# Patient Record
Sex: Female | Born: 2000 | Race: Black or African American | Hispanic: No | Marital: Single | Smoking: Never smoker
Health system: Southern US, Community
[De-identification: ages and names within clinical notes are randomized; demographics above are authoritative.]

---
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
## 2019-07-31 ENCOUNTER — Other Ambulatory Visit: Payer: Self-pay

## 2019-07-31 ENCOUNTER — Ambulatory Visit: Payer: Self-pay | Attending: Internal Medicine

## 2019-07-31 DIAGNOSIS — Z20822 Contact with and (suspected) exposure to covid-19: Secondary | ICD-10-CM

## 2019-08-01 LAB — NOVEL CORONAVIRUS, NAA: SARS-CoV-2, NAA: NOT DETECTED
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
## 2023-06-17 NOTE — ED Notes (Signed)
Patient left with no distress. Steady gait noted.patient alert and oriented x4.patient given discharge instructions and follow up care instructions and able to verbalize understanding.

## 2024-03-29 ENCOUNTER — Emergency Department (HOSPITAL_COMMUNITY)

## 2024-03-29 ENCOUNTER — Encounter (HOSPITAL_COMMUNITY): Payer: Self-pay

## 2024-03-29 ENCOUNTER — Emergency Department (HOSPITAL_COMMUNITY)
Admission: EM | Admit: 2024-03-29 | Discharge: 2024-03-29 | Disposition: A | Discharge status: Home / Self Care | Attending: Emergency Medicine | Admitting: Emergency Medicine

## 2024-03-29 ENCOUNTER — Other Ambulatory Visit: Payer: Self-pay

## 2024-03-29 DIAGNOSIS — Y92410 Unspecified street and highway as the place of occurrence of the external cause: Secondary | ICD-10-CM | POA: Diagnosis not present

## 2024-03-29 DIAGNOSIS — S0990XA Unspecified injury of head, initial encounter: Secondary | ICD-10-CM | POA: Insufficient documentation

## 2024-03-29 DIAGNOSIS — R519 Headache, unspecified: Secondary | ICD-10-CM | POA: Diagnosis present

## 2024-03-29 DIAGNOSIS — M545 Low back pain, unspecified: Secondary | ICD-10-CM | POA: Diagnosis not present

## 2024-03-29 LAB — RAPID URINE DRUG SCREEN, HOSP PERFORMED
Amphetamines: NOT DETECTED
Barbiturates: NOT DETECTED
Benzodiazepines: NOT DETECTED
Cocaine: NOT DETECTED
Opiates: NOT DETECTED
Tetrahydrocannabinol: NOT DETECTED

## 2024-03-29 LAB — PREGNANCY, URINE: Preg Test, Ur: NEGATIVE

## 2024-03-29 MED ORDER — OXYCODONE-ACETAMINOPHEN 5-325 MG PO TABS
2.0000 | ORAL_TABLET | Freq: Once | ORAL | Status: AC
  Administered 2024-03-29: 2 via ORAL
  Filled 2024-03-29: qty 2

## 2024-03-29 MED ORDER — METHOCARBAMOL 500 MG PO TABS: 500.0000 mg | ORAL_TABLET | Freq: Two times a day (BID) | ORAL | 0 refills | Status: AC
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
